# Patient Record
Sex: Male | Born: 1995 | Race: White | Hispanic: No | Marital: Married | Smoking: Never smoker
Health system: Southern US, Community
[De-identification: ages and names within clinical notes are randomized; demographics above are authoritative.]

## PROBLEM LIST (undated history)

## (undated) DIAGNOSIS — T7840XA Allergy, unspecified, initial encounter: Secondary | ICD-10-CM

## (undated) DIAGNOSIS — K219 Gastro-esophageal reflux disease without esophagitis: Secondary | ICD-10-CM

## (undated) DIAGNOSIS — R197 Diarrhea, unspecified: Secondary | ICD-10-CM

## (undated) HISTORY — DX: Allergy, unspecified, initial encounter: T78.40XA

## (undated) HISTORY — DX: Gastro-esophageal reflux disease without esophagitis: K21.9

## (undated) HISTORY — PX: OTHER SURGICAL HISTORY: SHX169

---
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
## 2016-11-21 ENCOUNTER — Ambulatory Visit
Admit: 2016-11-21 | Discharge: 2016-11-21 | Payer: PRIVATE HEALTH INSURANCE | Attending: Family Medicine | Primary: Family Medicine

## 2016-11-21 DIAGNOSIS — B354 Tinea corporis: Secondary | ICD-10-CM

## 2016-11-21 MED ORDER — ECONAZOLE 1 % TOPICAL CREAM
1 % | Freq: Two times a day (BID) | CUTANEOUS | 0 refills | Status: AC
Start: 2016-11-21 — End: ?

## 2016-11-21 NOTE — Progress Notes (Addendum)
[facility]  Sadie Haber, M.D.  [address]  [place], [place] [address]  Office : [phone]  Fax : [phone]  11/21/2016    HISTORY OF PRESENT ILLNESS  Dale Ford is a 21 y.o. male, unaccompanied  Chief Complaint:  rash  HPI:  New patient  Rash on chest for about 2 months.  No new medications (OTC or prescription). No new foods, soaps, detergents, pet or chemical exposures. Itches a little.  Has tried any treatment.    Had knot on right jaw that felt like something was lodged there.  Resolved after about a day  For a few days has had some mild right back pain.  Intermittent.  He had lifted some weights prior.  Feels tight/like it's compressed.      Review of Systems   Constitutional: Negative for fever.   HENT: Negative for sore throat.          No Known Allergies    History reviewed. No pertinent past medical history.    Past Surgical History:   Procedure Laterality Date   ??? HX CYST REMOVAL  06/2010       Social History     Social History   ??? Marital status: SINGLE     Spouse name: N/A   ??? Number of children: N/A   ??? Years of education: N/A     Occupational History   ??? Not on file.     Social History Main Topics   ??? Smoking status: Never Smoker   ??? Smokeless tobacco: Never Used   ??? Alcohol use No   ??? Drug use: No   ??? Sexual activity: Not on file     Other Topics Concern   ??? Not on file     Social History Narrative    Graduate of [employer] [phone]).  [employer] for [id number]       Family Status   Relation Status   ??? Mother Alive   ??? Father Alive   ??? Maternal Grandfather    ??? Paternal Grandfather      Family History   Problem Relation Age of Onset   ??? No Known Problems Mother    ??? No Known Problems Father    ??? Headache Maternal Grandfather    ??? Elevated Lipids Maternal Grandfather    ??? Headache Paternal Grandfather    ??? Elevated Lipids Paternal Grandfather        Visit Vitals   ??? BP 120/70 (BP 1 Location: Left arm, BP Patient Position: Sitting)   ??? Pulse 78    ??? Temp 97.8 ??F (36.6 ??C) (Oral)   ??? Ht 6\' 1"  (1.854 m)   ??? Wt 206 lb (93.4 kg)   ??? SpO2 99%   ??? BMI 27.18 kg/m2       Physical Exam   Constitutional: He appears well-developed and well-nourished. No distress.   HENT:   Head:       Mouth/Throat: Oropharynx is clear and moist and mucous membranes are normal.   Neck: Neck supple. No thyroid mass and no thyromegaly present.   Cardiovascular: Normal rate, regular rhythm and normal heart sounds.    Pulmonary/Chest: Effort normal and breath sounds normal.   Musculoskeletal:        Thoracic back: He exhibits no tenderness, no bony tenderness and no spasm.        Lumbar back: He exhibits no tenderness, no bony tenderness and no spasm.  Back:    Neurological: He is alert. Gait normal.   Skin:        Psychiatric: He has a normal mood and affect.       ASSESSMENT and PLAN  Diagnoses and all orders for this visit:    1. Tinea corporis - - New problem, stable  KOH of skin scrapings show fungal elements  Econazole cream    2. Acute right-sided thoracic back pain - - New problem, stable  Muscular.  May use tylenol/advil as needed, may apply ice/heat as needed.  Notify if any worsening symptoms    3. Lump in neck - - New problem, stable  Probable lymphadenopathy that has resolved.  Notify if any recurrence    Other orders  -     econazole nitrate (SPECTAZOLE) 1 % topical cream; Apply  to affected area two (2) times a day.    Elevated BMI not addressed due to acute problems

## 2016-11-22 NOTE — Telephone Encounter (Signed)
Can use over the counter lotrimin (clotrimazole).  That will be his least expensive option, prescription or not

## 2016-11-22 NOTE — Telephone Encounter (Signed)
Pt mother Dale Ford called on 11/22/2016 and stated that the insurance [facility] and it is too expensive to pay for out of pocket. Pt mother Dale Ford requested a new medication for the pt that is cheaper.

## 2016-11-23 NOTE — Telephone Encounter (Signed)
Called pt on 11/23/2016 and informed him to use Lotrimin (clotrimazole)  which is over the counter and the least expensive option prescription or not. Pt agreed and stated that he purchased it and it was working.

## 2016-12-14 ENCOUNTER — Ambulatory Visit
Admit: 2016-12-14 | Discharge: 2016-12-14 | Payer: PRIVATE HEALTH INSURANCE | Attending: Family Medicine | Primary: Family Medicine

## 2016-12-14 DIAGNOSIS — Z0000 Encounter for general adult medical examination without abnormal findings: Secondary | ICD-10-CM

## 2016-12-14 MED ORDER — ALUMINUM CHLORIDE 20 % TOPICAL SOLN
20 % | CUTANEOUS | 5 refills | Status: DC
Start: 2016-12-14 — End: 2016-12-14

## 2016-12-14 MED ORDER — ALUMINUM CHLORIDE 20 % TOPICAL SOLN
20 % | CUTANEOUS | 5 refills | Status: AC
Start: 2016-12-14 — End: ?

## 2016-12-14 NOTE — Patient Instructions (Addendum)
Learning About Physical Activity  What is physical activity?    Physical activity is any kind of activity that gets your body moving.  The types of physical activity that can help you get fit and stay healthy include:  ?? Aerobic or "cardio" activities that make your heart beat faster and make you breathe harder, such as brisk walking, riding a bike, or running. Aerobic activities strengthen your heart and lungs and build up your endurance.  ?? Strength training activities that make your muscles work against, or "resist," something, such as lifting weights or doing push-ups. These activities help tone and strengthen your muscles.  ?? Stretches that allow you to move your joints and muscles through their full range of motion. Stretching helps you be more flexible and avoid injury.  What are the benefits of physical activity?  Being active is one of the best things you can do to get fit and stay healthy. It helps you to:  ?? Feel stronger and have more energy to do all the things you like to do.  ?? Focus better at school or work and perform better in sports.  ?? Feel, think, and sleep better.  ?? Reach and stay at a healthy weight.  ?? Lose fat and build lean muscle.  ?? Lower your risk for serious health problems.  ?? Keep your bones, muscles, and joints strong.  Being fit lets you do more physical activity. And it lets you work out harder without as much effort.  How can you make physical activity part of your life?  Get at least 30 minutes of exercise on most days of the week. Walking is a good choice. You also may want to do other activities, such as running, swimming, cycling, or playing tennis or team sports.  Pick activities that you like-ones that make your heart beat faster, your muscles stronger, and your muscles and joints more flexible. If you find more than one thing you like doing, do them all. You don't have to do the same thing every day.   Get your heart pumping every day. Any activity that makes your heart beat faster and keeps it at that rate for a while counts.  Here are some great ways to get your heart beating faster:  ?? Go for a brisk walk, run, or bike ride.  ?? Go for a hike or swim.  ?? Go in-line skating.  ?? Play a game of touch football, basketball, or soccer.  ?? Ride a bike.  ?? Play tennis or racquetball.  ?? Climb stairs.  Even some household chores can be aerobic-just do them at a faster pace. Vacuuming, raking or mowing the lawn, sweeping the garage, and washing and waxing the car all can help get your heart rate up.  Strengthen your muscles during the week. You don't have to lift heavy weights or grow big, bulky muscles to get stronger. Doing a few simple activities that make your muscles work against, or "resist," something can help you get stronger.  For example, you can:  ?? Do push-ups or sit-ups, which use your own body weight as resistance.  ?? Lift weights or dumbbells or use stretch bands at home or in a gym or community center.  Stretch your muscles often. Stretching will help you as you become more active. It can help you stay flexible, loosen tight muscles, and avoid injury. It can also help improve your balance and posture and can be a great way to relax.  Be sure   to stretch the muscles you'll be using when you work out. It's best to warm your muscles slightly before you stretch them. Walk or do some other light aerobic activity for a few minutes, and then start stretching.  When you stretch your muscles:  ?? Do it slowly. Stretching is not about going fast or making sudden movements.  ?? Don't push or bounce during a stretch.  ?? Hold each stretch for at least 15 to 30 seconds, if you can. You should feel a stretch in the muscle, but not pain.  ?? Breathe out as you do the stretch. Then breathe in as you hold the stretch. Don't hold your breath.  If you're worried about how more activity might affect your health, have a  checkup before you start. Follow any special advice your doctor gives you for getting a smart start.  Where can you learn more?  Go to http://www.healthwise.net/GoodHelpConnections.  Enter W332 in the search box to learn more about "Learning About Physical Activity."  Current as of: August 22, 2015  Content Version: 11.4  ?? 2006-2017 Healthwise, Incorporated. Care instructions adapted under license by [employer] (which disclaims liability or warranty for this information). If you have questions about a medical condition or this instruction, always ask your healthcare professional. Healthwise, Incorporated disclaims any warranty or liability for your use of this information.

## 2016-12-14 NOTE — Progress Notes (Signed)
[facility]  Sadie Haberavid Kmarion Rawl, M.D.  [address]  [place], [place] [id number]  Office : [phone]  Fax : [phone]  12/14/2016    HISTORY OF PRESENT ILLNESS  Dale Ford is a 21 y.o. male, unaccompanied'  Chief Complaint:  physical  HPI:  In for physical  Feeling well    Review of Systems   Musculoskeletal:        Periodic sore spot on spine         No Known Allergies    History reviewed. No pertinent past medical history.    Past Surgical History:   Procedure Laterality Date   ??? HX CYST REMOVAL  06/2010       Social History     Social History   ??? Marital status: SINGLE     Spouse name: N/A   ??? Number of children: N/A   ??? Years of education: N/A     Occupational History   ??? Not on file.     Social History Main Topics   ??? Smoking status: Never Smoker   ??? Smokeless tobacco: Never Used   ??? Alcohol use No   ??? Drug use: No   ??? Sexual activity: Not on file     Other Topics Concern   ??? Not on file     Social History Narrative    Graduate of [employer] [phone]).  [employer] [address] [id number]       Family Status   Relation Status   ??? Mother Alive   ??? Father Alive   ??? Maternal Grandfather    ??? Paternal Grandfather      Family History   Problem Relation Age of Onset   ??? No Known Problems Mother    ??? No Known Problems Father    ??? Headache Maternal Grandfather    ??? Elevated Lipids Maternal Grandfather    ??? Headache Paternal Grandfather    ??? Elevated Lipids Paternal Grandfather        Visit Vitals   ??? BP 120/70 (BP 1 Location: Left arm, BP Patient Position: Sitting)   ??? Temp 98.1 ??F (36.7 ??C) (Oral)   ??? Ht 6\' 3"  (1.905 m)   ??? Wt 204 lb (92.5 kg)   ??? BMI 25.5 kg/m2       Physical Exam   Constitutional: He appears well-developed and well-nourished.   HENT:   Right Ear: Hearing and tympanic membrane normal.   Left Ear: Hearing and tympanic membrane normal.   Nose: No mucosal edema or rhinorrhea.   Mouth/Throat: Oropharynx is clear and moist and mucous membranes are normal.    Eyes: Pupils are equal, round, and reactive to light.   Neck: Neck supple. No thyroid mass and no thyromegaly present.   Cardiovascular: Normal rate, regular rhythm and normal heart sounds.    Pulses:       Radial pulses are 2+ on the right side, and 2+ on the left side.        Posterior tibial pulses are 2+ on the right side, and 2+ on the left side.   No edema   Pulmonary/Chest: Breath sounds normal.   Abdominal: Soft. Bowel sounds are normal. There is no hepatosplenomegaly. There is no tenderness.   Musculoskeletal:        Back:    Lymphadenopathy:     He has no cervical adenopathy.   Neurological: He is alert. Gait normal.   Reflex Scores:  Bicep reflexes are 1+ on the right side and 1+ on the left side.       Brachioradialis reflexes are 1+ on the right side and 1+ on the left side.       Patellar reflexes are 2+ on the right side and 2+ on the left side.  Psychiatric: He has a normal mood and affect. His speech is normal and behavior is normal.       ASSESSMENT and PLAN  Diagnoses and all orders for this visit:    1. Physical exam, annual  -     CBC WITH AUTOMATED DIFF  -     METABOLIC PANEL, COMPREHENSIVE ([address])  -     LIPID PANEL ([address])  -     URINALYSIS W/MICROSCOPIC  Offered HIV testing, he declines.  Counseled for regular condom use if sexually active  Exercise - 150 minutes per week of moderate aerobic activity (like brisk walking) or 75 minutes/week of high intensity exercise; stretching daily; strength training minimum 2 days/week - handout provided  He reports his Tdap is up to date - check on actual date and let us know  Recommend annual flu vaccine

## 2016-12-14 NOTE — Progress Notes (Signed)
HISTORY OF PRESENT ILLNESS  Dale Ford is a 21 y.o. male.  Chief Complaint:  sweating  HPI:  In for physical  Has had excessive sweating in axilla for a few years.  Has tried several different deodorants/antipersperiants.  No issues with palms or feet    Review of Systems   Constitutional: Negative for fever.         No Known Allergies    History reviewed. No pertinent past medical history.    Past Surgical History:   Procedure Laterality Date   ??? HX CYST REMOVAL  06/2010       Social History     Social History   ??? Marital status: SINGLE     Spouse name: N/A   ??? Number of children: N/A   ??? Years of education: N/A     Occupational History   ??? Not on file.     Social History Main Topics   ??? Smoking status: Never Smoker   ??? Smokeless tobacco: Never Used   ??? Alcohol use No   ??? Drug use: No   ??? Sexual activity: Not on file     Other Topics Concern   ??? Not on file     Social History Narrative    Graduate [employer] [phone]).  [employer] [address] [employer] [address] [id number]       Family Status   Relation Status   ??? Mother Alive   ??? Father Alive   ??? Maternal Grandfather    ??? Paternal Grandfather      Family History   Problem Relation Age of Onset   ??? No Known Problems Mother    ??? No Known Problems Father    ??? Headache Maternal Grandfather    ??? Elevated Lipids Maternal Grandfather    ??? Headache Paternal Grandfather    ??? Elevated Lipids Paternal Grandfather        Visit Vitals   ??? BP 120/70 (BP 1 Location: Left arm, BP Patient Position: Sitting)   ??? Temp 98.1 ??F (36.7 ??C) (Oral)   ??? Ht 6\' 3"  (1.905 m)   ??? Wt 204 lb (92.5 kg)   ??? BMI 25.5 kg/m2     Physical Exam - see other note    ASSESSMENT and PLAN  Diagnoses and all orders for this visit: - separate from physical    1.  Hyperhidrosis - - New problem, stable  May try Drysol - nightly until symptoms improve, then cut back on frequency  -     aluminum chloride (DRYSOL) 20 % external solution; Apply to affected  area each night - may decrease frequency as sweating improves    Contact office if symptoms worsen/persist

## 2016-12-15 LAB — CBC WITH AUTOMATED DIFF
ABS. BASOPHILS: 0.1 10*3/uL (ref 0.0–0.2)
ABS. EOSINOPHILS: 0.3 10*3/uL (ref 0.0–0.4)
ABS. IMM. GRANS.: 0 10*3/uL (ref 0.0–0.1)
ABS. MONOCYTES: 0.4 10*3/uL (ref 0.1–0.9)
ABS. NEUTROPHILS: 1.5 10*3/uL (ref 1.4–7.0)
Abs Lymphocytes: 1.5 10*3/uL (ref 0.7–3.1)
BASOPHILS: 2 %
EOSINOPHILS: 8 %
HCT: 42.7 % (ref 37.5–51.0)
HGB: 14.6 g/dL (ref 13.0–17.7)
IMMATURE GRANULOCYTES: 0 %
Lymphocytes: 40 %
MCH: 30.5 pg (ref 26.6–33.0)
MCHC: 34.2 g/dL (ref 31.5–35.7)
MCV: 89 fL (ref 79–97)
MONOCYTES: 11 %
NEUTROPHILS: 39 %
PLATELET: 245 10*3/uL (ref 150–379)
RBC: 4.79 x10E6/uL (ref 4.14–5.80)
RDW: 12.6 % (ref 12.3–15.4)
WBC: 3.8 10*3/uL (ref 3.4–10.8)

## 2016-12-15 LAB — URINALYSIS W/MICROSCOPIC
Bilirubin: NEGATIVE
Blood: NEGATIVE
Glucose: NEGATIVE
Ketone: NEGATIVE
Leukocyte Esterase: NEGATIVE
Nitrites: NEGATIVE
Protein: NEGATIVE
Specific Gravity: >=1.03 — AB (ref 1.005–1.030)
Urobilinogen: 1 mg/dL (ref 0.2–1.0)
pH (UA): 5.5 (ref 5.0–7.5)

## 2016-12-15 LAB — MICROSCOPIC EXAMINATION
Bacteria: NONE SEEN
Casts: NONE SEEN /lpf

## 2016-12-15 LAB — METABOLIC PANEL, COMPREHENSIVE
A-G Ratio: 2.2 (ref 1.2–2.2)
ALT (SGPT): 20 IU/L (ref 0–44)
AST (SGOT): 22 IU/L (ref 0–40)
Albumin: 4.8 g/dL (ref 3.5–5.5)
Alk. phosphatase: 40 IU/L (ref 39–117)
BUN/Creatinine ratio: 10 (ref 9–20)
BUN: 11 mg/dL (ref 6–20)
Bilirubin, total: 0.6 mg/dL (ref 0.0–1.2)
CO2: 20 mmol/L (ref 20–29)
Calcium: 9.5 mg/dL (ref 8.7–10.2)
Chloride: 104 mmol/L (ref 96–106)
Creatinine: 1.06 mg/dL (ref 0.76–1.27)
GFR est AA: 115 mL/min/{1.73_m2} (ref >59)
GFR est non-AA: 99 mL/min/{1.73_m2} (ref >59)
GLOBULIN, TOTAL: 2.2 g/dL (ref 1.5–4.5)
Glucose: 86 mg/dL (ref 65–99)
Potassium: 4.2 mmol/L (ref 3.5–5.2)
Protein, total: 7 g/dL (ref 6.0–8.5)
Sodium: 141 mmol/L (ref 134–144)

## 2016-12-15 LAB — LIPID PANEL
Cholesterol, total: 124 mg/dL (ref 100–199)
HDL Cholesterol: 34 mg/dL — ABNORMAL LOW (ref >39)
LDL, calculated: 80 mg/dL (ref 0–99)
Triglyceride: 50 mg/dL (ref 0–149)
VLDL, calculated: 10 mg/dL (ref 5–40)

## 2016-12-15 NOTE — Progress Notes (Signed)
Urine is very concentrated  HDL cholesterol is low  Metabolic panel and blood count are ok    Recommendations - drink more water  Cardio can help raise HDL  Can repeat labs in 1 year at annual physical  Please call with results as I am unable to release to mychart at this time

## 2016-12-19 NOTE — Progress Notes (Signed)
Patient notified, voices acceptance and understanding

## 2018-01-15 ENCOUNTER — Ambulatory Visit
Admit: 2018-01-15 | Discharge: 2018-01-15 | Payer: PRIVATE HEALTH INSURANCE | Attending: Family Medicine | Primary: Family Medicine

## 2018-01-15 ENCOUNTER — Ambulatory Visit: Attending: Family Medicine | Primary: Family Medicine

## 2018-01-15 DIAGNOSIS — Z0000 Encounter for general adult medical examination without abnormal findings: Secondary | ICD-10-CM

## 2018-01-15 NOTE — Progress Notes (Signed)
[facility]  Sadie Haber, M.D.  [address]  [place], [place] [address]  Office : [phone]  Fax : [phone]  01/15/2018    HISTORY OF PRESENT ILLNESS  Dale Ford is a 22 y.o. male,  unaccompanied    Chief Complaint:  physical    HPI:  In for physical  Exercise - cardio - running/treadmill, some ab works, sometimes biking - although not as much lately  Got Tdap in 2012    Review of Systems   Constitutional: Positive for malaise/fatigue.       No Known Allergies    History reviewed. No pertinent past medical history.    Past Surgical History:   Procedure Laterality Date   ??? HX CYST REMOVAL  06/2010       Social History     Socioeconomic History   ??? Marital status: SINGLE     Spouse name: Not on file   ??? Number of children: Not on file   ??? Years of education: Not on file   ??? Highest education level: Not on file   Tobacco Use   ??? Smoking status: Never Smoker   ??? Smokeless tobacco: Never Used   Substance and Sexual Activity   ??? Alcohol use: Yes     Frequency: Monthly or less     Drinks per session: 1 or 2     Binge frequency: Never   ??? Drug use: No   Social History [employer] [phone]).  [employer] for [id number]       Family Status   Relation Name Status   ??? Mother  Alive   ??? Father  Alive   ??? MGF  (Not Specified)   ??? PGF  (Not Specified)     Family History   Problem Relation Age of Onset   ??? No Known Problems Mother    ??? No Known Problems Father    ??? Headache Maternal Grandfather    ??? Elevated Lipids Maternal Grandfather    ??? Headache Paternal Grandfather    ??? Elevated Lipids Paternal Grandfather        Visit Vitals  BP 128/72 (BP 1 Location: Left arm, BP Patient Position: Sitting)   Temp 98 ??F (36.7 ??C) (Oral)   Ht 6\' 3"  (1.905 m)   Wt 208 lb (94.3 kg)   BMI 26.00 kg/m??       Physical Exam   Constitutional: He appears well-developed and well-nourished.   HENT:   Right Ear: Tympanic membrane and ear canal normal.   Left Ear: Tympanic membrane and  ear canal normal.   Nose: No mucosal edema or rhinorrhea.   Mouth/Throat: Oropharynx is clear and moist and mucous membranes are normal.   Eyes: Pupils are equal, round, and reactive to light. Conjunctivae and lids are normal.   Neck: Phonation normal. Neck supple. Carotid bruit is not present. No thyroid mass and no thyromegaly present.   Cardiovascular: Normal rate, regular rhythm and normal heart sounds.   Pulses:       Radial pulses are 2+ on the right side, and 2+ on the left side.        Posterior tibial pulses are 2+ on the right side, and 2+ on the left side.   No edema   Pulmonary/Chest: Effort normal and breath sounds normal.   Abdominal: Soft. Bowel sounds are normal. There is no hepatosplenomegaly. There is no tenderness.   Lymphadenopathy:  He has no cervical adenopathy.        Right: No supraclavicular adenopathy present.        Left: No supraclavicular adenopathy present.   Neurological: He is alert. Gait normal.   Reflex Scores:       Bicep reflexes are 1+ on the right side and 1+ on the left side.       Brachioradialis reflexes are 1+ on the right side and 1+ on the left side.       Patellar reflexes are 1+ on the right side and 1+ on the left side.  Skin: Skin is warm and dry.   Psychiatric: He has a normal mood and affect. His speech is normal and behavior is normal.       ASSESSMENT and PLAN  Diagnoses and all orders for this visit:    1. Annual physical exam  -     CBC WITH AUTOMATED DIFF  -     METABOLIC PANEL, COMPREHENSIVE  -     LIPID PANEL  -     URINALYSIS W/MICROSCOPIC    Checking labs.    Encouraged regular exercise - cardio and strength training.  Discussed high intensity interval training.    Discussed elevated BMI - regular exercise and some weight loss can help raise HDL.  Healthy diet  Recommend annual flu vaccine, when available

## 2018-01-15 NOTE — Progress Notes (Signed)
CBC (blood count) is normal  Metabolic panel - blood sugar (glucose), kidney, and electrolytes are normal.  One liver test (ALT) is mildly elevated, other liver tests are normal  Lipid panel - Total cholesterol is ok, but worse than last year.  Triglycerides are normal.  HDL (good cholesterol) is a little better than last year, but remains low.  LDL (bad cholesterol) is mildly elevated, but worse than last year  Urinalysis is normal    Liver test is not significant will watch.  Work on diet a little more closely.  Recheck 1 year at annual physical  Dr. Wilson SingerHudson  Results released to Mychart with comments

## 2018-01-15 NOTE — Progress Notes (Signed)
[facility]  Sadie Haberavid Edinson Domeier, M.D.  [address]  [place], [place] [id number]  Office : [phone]  Fax : [phone]  01/15/2018    HISTORY OF PRESENT ILLNESS  Dale Ford is a 22 y.o. male,  unaccompanied    Chief Complaint:  physical    HPI:  In for physical  Exercise - cardio - running/treadmill, some ab works, sometimes biking - although not as much lately  Got Tdap in 2012    Review of Systems   Constitutional: Positive for malaise/fatigue.       No Known Allergies    History reviewed. No pertinent past medical history.    Past Surgical History:   Procedure Laterality Date   ??? HX CYST REMOVAL  06/2010       Social History     Socioeconomic History   ??? Marital status: SINGLE     Spouse name: Not on file   ??? Number of children: Not on file   ??? Years of education: Not on file   ??? Highest education level: Not on file   Tobacco Use   ??? Smoking status: Never Smoker   ??? Smokeless tobacco: Never Used   Substance and Sexual Activity   ??? Alcohol use: Yes     Frequency: Monthly or less     Drinks per session: 1 or 2     Binge frequency: Never   ??? Drug use: No   Social History [employer]    [employer] [phone]).  [employer] [address] [id number]       Family Status   Relation Name Status   ??? Mother  Alive   ??? Father  Alive   ??? MGF  (Not Specified)   ??? PGF  (Not Specified)     Family History   Problem Relation Age of Onset   ??? No Known Problems Mother    ??? No Known Problems Father    ??? Headache Maternal Grandfather    ??? Elevated Lipids Maternal Grandfather    ??? Headache Paternal Grandfather    ??? Elevated Lipids Paternal Grandfather        Visit Vitals  BP 128/72 (BP 1 Location: Left arm, BP Patient Position: Sitting)   Temp 98 ??F (36.7 ??C) (Oral)   Ht 6\' 3"  (1.905 m)   Wt 208 lb (94.3 kg)   BMI 26.00 kg/m??       Physical Exam   Constitutional: He appears well-developed and well-nourished.   HENT:   Right Ear: Tympanic membrane and ear canal normal.    Left Ear: Tympanic membrane and ear canal normal.   Nose: No mucosal edema or rhinorrhea.   Mouth/Throat: Oropharynx is clear and moist and mucous membranes are normal.   Eyes: Pupils are equal, round, and reactive to light. Conjunctivae and lids are normal.   Neck: Phonation normal. Neck supple. Carotid bruit is not present. No thyroid mass and no thyromegaly present.   Cardiovascular: Normal rate, regular rhythm and normal heart sounds.   Pulses:       Radial pulses are 2+ on the right side, and 2+ on the left side.        Posterior tibial pulses are 2+ on the right side, and 2+ on the left side.   No edema   Pulmonary/Chest: Effort normal and breath sounds normal.   Abdominal: Soft. Bowel sounds are normal. There is no hepatosplenomegaly. There is no tenderness.   Lymphadenopathy:  He has no cervical adenopathy.        Right: No supraclavicular adenopathy present.        Left: No supraclavicular adenopathy present.   Neurological: He is alert. Gait normal.   Reflex Scores:       Bicep reflexes are 1+ on the right side and 1+ on the left side.       Brachioradialis reflexes are 1+ on the right side and 1+ on the left side.       Patellar reflexes are 1+ on the right side and 1+ on the left side.  Skin: Skin is warm and dry.   Psychiatric: He has a normal mood and affect. His speech is normal and behavior is normal.       ASSESSMENT and PLAN  Diagnoses and all orders for this visit:    1. Annual physical exam  -     CBC WITH AUTOMATED DIFF  -     METABOLIC PANEL, COMPREHENSIVE  -     LIPID PANEL  -     URINALYSIS W/MICROSCOPIC    Checking labs.    Encouraged regular exercise - cardio and strength training.  Discussed high intensity interval training.    Discussed elevated BMI - regular exercise and some weight loss can help raise HDL.  Healthy diet  Recommend annual flu vaccine, when available

## 2018-01-15 NOTE — Progress Notes (Signed)
CBC (blood count) is normal  Metabolic panel - blood sugar (glucose), kidney, and electrolytes are normal.  One liver test (ALT) is mildly elevated, other liver tests are normal  Lipid panel - Total cholesterol is ok, but worse than last year.  Triglycerides are normal.  HDL (good cholesterol) is a little better than last year, but remains low.  LDL (bad cholesterol) is mildly elevated, but worse than last year  Urinalysis is normal    Liver test is not significant will watch.  Work on diet a little more closely.  Recheck 1 year at annual physical  Dr. Areej Tayler  Results released to Mychart with comments

## 2018-01-16 LAB — COMPREHENSIVE METABOLIC PANEL
ALT: 58 IU/L — ABNORMAL HIGH (ref 0–44)
AST: 32 IU/L (ref 0–40)
Albumin/Globulin Ratio: 1.9 NA (ref 1.2–2.2)
Albumin: 5.1 g/dL (ref 3.5–5.5)
Alkaline Phosphatase: 39 IU/L (ref 39–117)
BUN: 10 mg/dL (ref 6–20)
Bun/Cre Ratio: 9 NA (ref 9–20)
CO2: 22 mmol/L (ref 20–29)
Calcium: 9.7 mg/dL (ref 8.7–10.2)
Chloride: 101 mmol/L (ref 96–106)
Creatinine: 1.06 mg/dL (ref 0.76–1.27)
EGFR IF NonAfrican American: 98 mL/min/{1.73_m2} (ref >59)
GFR African American: 114 mL/min/{1.73_m2} (ref >59)
Globulin, Total: 2.7 g/dL (ref 1.5–4.5)
Glucose: 83 mg/dL (ref 65–99)
Potassium: 4.3 mmol/L (ref 3.5–5.2)
Sodium: 139 mmol/L (ref 134–144)
Total Bilirubin: 1.2 mg/dL (ref 0.0–1.2)
Total Protein: 7.8 g/dL (ref 6.0–8.5)

## 2018-01-16 LAB — CBC WITH AUTO DIFFERENTIAL
Basophils %: 1 %
Basophils Absolute: 0.1 10*3/uL (ref 0.0–0.2)
Eosinophils %: 5 %
Eosinophils Absolute: 0.3 10*3/uL (ref 0.0–0.4)
Granulocyte Absolute Count: 0 10*3/uL (ref 0.0–0.1)
Hematocrit: 44.1 % (ref 37.5–51.0)
Hemoglobin: 15.6 g/dL (ref 13.0–17.7)
Immature Granulocytes: 0 %
Lymphocytes %: 41 %
Lymphocytes Absolute: 2.1 10*3/uL (ref 0.7–3.1)
MCH: 30.4 pg (ref 26.6–33.0)
MCHC: 35.4 g/dL (ref 31.5–35.7)
MCV: 86 fL (ref 79–97)
Monocytes %: 9 %
Monocytes Absolute: 0.5 10*3/uL (ref 0.1–0.9)
Neutrophils %: 44 %
Neutrophils Absolute: 2.2 10*3/uL (ref 1.4–7.0)
Platelets: 273 10*3/uL (ref 150–450)
RBC: 5.13 x10E6/uL (ref 4.14–5.80)
RDW: 12.8 % (ref 12.3–15.4)
WBC: 5.1 10*3/uL (ref 3.4–10.8)

## 2018-01-16 LAB — URINALYSIS WITH MICROSCOPIC
Bilirubin, Urine: NEGATIVE
Blood, Urine: NEGATIVE
Glucose, UA: NEGATIVE
Ketones, Urine: NEGATIVE
Leukocyte Esterase, Urine: NEGATIVE
Nitrite, Urine: NEGATIVE
Protein, UA: NEGATIVE
Specific Gravity, UA: 1.021 NA (ref 1.005–1.030)
Urobilinogen, Urine: 0.2 mg/dL (ref 0.2–1.0)
pH, UA: 7 NA (ref 5.0–7.5)

## 2018-01-16 LAB — MICROSCOPIC EXAMINATION
BACTERIA, URINE: NONE SEEN
Bacteria: NONE SEEN
Casts UA: NONE SEEN /lpf
Casts: NONE SEEN /lpf

## 2018-01-16 LAB — LIPID PANEL
Cholesterol, Total: 159 mg/dL (ref 100–199)
Cholesterol, total: 159 mg/dL (ref 100–199)
HDL Cholesterol: 37 mg/dL — ABNORMAL LOW (ref >39)
HDL: 37 mg/dL — ABNORMAL LOW (ref >39)
LDL Calculated: 107 mg/dL — ABNORMAL HIGH (ref 0–99)
LDL, calculated: 107 mg/dL — ABNORMAL HIGH (ref 0–99)
Triglyceride: 74 mg/dL (ref 0–149)
Triglycerides: 74 mg/dL (ref 0–149)
VLDL Cholesterol Calculated: 15 mg/dL (ref 5–40)
VLDL, calculated: 15 mg/dL (ref 5–40)

## 2018-01-16 LAB — URINALYSIS W/MICROSCOPIC
Bilirubin: NEGATIVE
Blood: NEGATIVE
Glucose: NEGATIVE
Ketone: NEGATIVE
Leukocyte Esterase: NEGATIVE
Nitrites: NEGATIVE
Protein: NEGATIVE
Specific Gravity: 1.021 (ref 1.005–1.030)
Urobilinogen: 0.2 mg/dL (ref 0.2–1.0)
pH (UA): 7 (ref 5.0–7.5)

## 2018-01-16 LAB — METABOLIC PANEL, COMPREHENSIVE
A-G Ratio: 1.9 (ref 1.2–2.2)
ALT (SGPT): 58 IU/L — ABNORMAL HIGH (ref 0–44)
AST (SGOT): 32 IU/L (ref 0–40)
Albumin: 5.1 g/dL (ref 3.5–5.5)
Alk. phosphatase: 39 IU/L (ref 39–117)
BUN/Creatinine ratio: 9 (ref 9–20)
BUN: 10 mg/dL (ref 6–20)
Bilirubin, total: 1.2 mg/dL (ref 0.0–1.2)
CO2: 22 mmol/L (ref 20–29)
Calcium: 9.7 mg/dL (ref 8.7–10.2)
Chloride: 101 mmol/L (ref 96–106)
Creatinine: 1.06 mg/dL (ref 0.76–1.27)
GFR est AA: 114 mL/min/{1.73_m2} (ref >59)
GFR est non-AA: 98 mL/min/{1.73_m2} (ref >59)
GLOBULIN, TOTAL: 2.7 g/dL (ref 1.5–4.5)
Glucose: 83 mg/dL (ref 65–99)
Potassium: 4.3 mmol/L (ref 3.5–5.2)
Protein, total: 7.8 g/dL (ref 6.0–8.5)
Sodium: 139 mmol/L (ref 134–144)

## 2018-01-16 LAB — CBC WITH AUTOMATED DIFF
ABS. BASOPHILS: 0.1 10*3/uL (ref 0.0–0.2)
ABS. EOSINOPHILS: 0.3 10*3/uL (ref 0.0–0.4)
ABS. IMM. GRANS.: 0 10*3/uL (ref 0.0–0.1)
ABS. MONOCYTES: 0.5 10*3/uL (ref 0.1–0.9)
ABS. NEUTROPHILS: 2.2 10*3/uL (ref 1.4–7.0)
Abs Lymphocytes: 2.1 10*3/uL (ref 0.7–3.1)
BASOPHILS: 1 %
EOSINOPHILS: 5 %
HCT: 44.1 % (ref 37.5–51.0)
HGB: 15.6 g/dL (ref 13.0–17.7)
IMMATURE GRANULOCYTES: 0 %
Lymphocytes: 41 %
MCH: 30.4 pg (ref 26.6–33.0)
MCHC: 35.4 g/dL (ref 31.5–35.7)
MCV: 86 fL (ref 79–97)
MONOCYTES: 9 %
NEUTROPHILS: 44 %
PLATELET: 273 10*3/uL (ref 150–450)
RBC: 5.13 x10E6/uL (ref 4.14–5.80)
RDW: 12.8 % (ref 12.3–15.4)
WBC: 5.1 10*3/uL (ref 3.4–10.8)

## 2019-03-01 ENCOUNTER — Emergency Department (HOSPITAL_BASED_OUTPATIENT_CLINIC_OR_DEPARTMENT_OTHER): Payer: 59

## 2019-03-01 ENCOUNTER — Other Ambulatory Visit: Payer: Self-pay

## 2019-03-01 ENCOUNTER — Emergency Department (HOSPITAL_BASED_OUTPATIENT_CLINIC_OR_DEPARTMENT_OTHER)
Admission: EM | Admit: 2019-03-01 | Discharge: 2019-03-01 | Disposition: A | Discharge status: Home / Self Care | Payer: 59 | Attending: Emergency Medicine | Admitting: Emergency Medicine

## 2019-03-01 ENCOUNTER — Encounter (HOSPITAL_BASED_OUTPATIENT_CLINIC_OR_DEPARTMENT_OTHER): Payer: Self-pay | Admitting: Emergency Medicine

## 2019-03-01 DIAGNOSIS — J029 Acute pharyngitis, unspecified: Secondary | ICD-10-CM

## 2019-03-01 DIAGNOSIS — U071 COVID-19: Secondary | ICD-10-CM | POA: Diagnosis not present

## 2019-03-01 DIAGNOSIS — R079 Chest pain, unspecified: Secondary | ICD-10-CM | POA: Diagnosis not present

## 2019-03-01 LAB — GROUP A STREP BY PCR: Group A Strep by PCR: NOT DETECTED

## 2019-03-01 MED ORDER — DEXAMETHASONE 6 MG PO TABS
10.0000 mg | ORAL_TABLET | Freq: Once | ORAL | Status: AC
  Administered 2019-03-01: 10 mg via ORAL
  Filled 2019-03-01: qty 1

## 2019-03-01 NOTE — ED Triage Notes (Signed)
Pt here with chest pain and palpitations with sore throat x 2 days

## 2019-03-01 NOTE — ED Provider Notes (Signed)
[facility] EMERGENCY DEPARTMENT Provider Note   CSN: [id number] Arrival date & time: 03/01/19  1057     History   Chief Complaint Chief Complaint  Patient presents with  . Chest Pain  . Sore Throat    HPI Peter Woodard is a 23 y.o. male.     The history is provided by the patient.  Sore Throat This is a new problem. The current episode started 2 days ago. The problem occurs daily. The problem has not changed since onset.Associated symptoms include chest pain. Pertinent negatives include no abdominal pain, no headaches and no shortness of breath. Nothing aggravates the symptoms. Nothing relieves the symptoms. He has tried nothing for the symptoms. The treatment provided no relief.    History reviewed. No pertinent past medical history.  There are no active problems to display for this patient.   History reviewed. No pertinent surgical history.      Home Medications    Prior to Admission medications   Not on File    Family History History reviewed. No pertinent family history.  Social History Social History   Tobacco Use  . Smoking status: Never Smoker  . Smokeless tobacco: Never Used  Substance Use Topics  . Alcohol use: Never    Frequency: Never  . Drug use: Never     Allergies   Patient has no allergy information on record.   Review of Systems Review of Systems  Constitutional: Negative for chills and fever.  HENT: Negative for ear pain and sore throat.   Eyes: Negative for pain and visual disturbance.  Respiratory: Positive for cough. Negative for shortness of breath.   Cardiovascular: Positive for chest pain and palpitations.  Gastrointestinal: Negative for abdominal pain and vomiting.  Genitourinary: Negative for dysuria and hematuria.  Musculoskeletal: Negative for arthralgias and back pain.  Skin: Negative for color change and rash.  Neurological: Negative for seizures, syncope and headaches.  All other systems reviewed and  are negative.    Physical Exam Updated Vital Signs  ED Triage Vitals  Enc Vitals Group     BP 03/01/19 1111 (!) 151/87     Pulse Rate 03/01/19 1111 84     Resp 03/01/19 1111 19     Temp 03/01/19 1111 98.6 F (37 C)     Temp src --      SpO2 03/01/19 1111 100 %     Weight --      Height --      Head Circumference --      Peak Flow --      Pain Score 03/01/19 1107 8     Pain Loc --      Pain Edu? --      Excl. in [place]? --     Physical Exam Vitals signs and nursing note reviewed.  Constitutional:      Appearance: He is well-developed.  HENT:     Head: Normocephalic and atraumatic.  Eyes:     Extraocular Movements: Extraocular movements intact.     Conjunctiva/sclera: Conjunctivae normal.     Pupils: Pupils are equal, round, and reactive to light.  Neck:     Musculoskeletal: Normal range of motion and neck supple.     Comments: No signs of pharyngeal inflammation, tonsils without exudates or abscess Cardiovascular:     Rate and Rhythm: Normal rate and regular rhythm.     Pulses:          Radial pulses are 2+ on the right side  and 2+ on the left side.     Heart sounds: Normal heart sounds. No murmur.  Pulmonary:     Effort: Pulmonary effort is normal. No respiratory distress.     Breath sounds: Normal breath sounds. No decreased breath sounds, wheezing or rhonchi.  Chest:     Chest wall: No tenderness.  Abdominal:     Palpations: Abdomen is soft.     Tenderness: There is no abdominal tenderness.  Musculoskeletal: Normal range of motion.     Right lower leg: No edema.     Left lower leg: No edema.  Skin:    General: Skin is warm and dry.  Neurological:     Mental Status: He is alert.      ED Treatments / Results  Labs (all labs ordered are listed, but only abnormal results are displayed) Labs Reviewed  GROUP A STREP BY PCR  NOVEL CORONAVIRUS, NAA (HOSP ORDER, SEND-OUT TO REF LAB; TAT 18-24 HRS)    EKG EKG Interpretation  Date/Time:  Sunday March 01 2019 11:10:17 EDT Ventricular Rate:  75 PR Interval:    QRS Duration: 84 QT Interval:  367 QTC Calculation: 410 R Axis:   70 Text Interpretation:  Sinus rhythm Consider left atrial enlargement Confirmed by Virgina Norfolk [phone]) on 03/01/2019 12:23:32 PM   Radiology Dg Chest Portable 1 View  Result Date: 03/01/2019 CLINICAL DATA:  Cough EXAM: PORTABLE CHEST 1 VIEW COMPARISON:  None. FINDINGS: The heart size and mediastinal contours are within normal limits. Both lungs are clear. The visualized skeletal structures are unremarkable. IMPRESSION: No acute abnormality of the lungs in AP portable projection. Electronically Signed   By: Lauralyn Primes M.D.   On: 03/01/2019 12:23    Procedures Procedures (including critical care time)  Medications Ordered in ED Medications  dexamethasone (DECADRON) tablet 10 mg (has no administration in time range)     Initial Impression / Assessment and Plan / ED Course  I have reviewed the triage vital signs and the nursing notes.  Pertinent labs & imaging results that were available during my care of the patient were reviewed by me and considered in my medical decision making (see chart for details).     Peter Woodard is a 23 year old male with no significant medical history who presents to the ED with sore throat, chest pain, cough.  Patient with normal vitals.  No fever.  Symptoms for the last 2 days.  Denies any fevers at home.  Works a Youth worker job.  No known coronavirus exposures.  Patient has clear breath sounds.  No signs of throat infection on exam.  However will swab for coronavirus, strep pharyngitis and proceed chest x-ray.  EKG showed sinus rhythm.  No ischemic changes.  Patient is PERC negative and doubt PE.  Patient does not have any cardiac risk factors and symptoms are more consistent with a viral process than ACS.  Strep screen negative.  Chest x-ray without any signs of pneumonia, no pneumothorax, no pleural effusion.  COVID test is  pending.  Given information for self-isolation.  Given Decadron for symptomatic relief.  Given return precautions and discharged in ED in good condition.  This chart was dictated using voice recognition software.  Despite best efforts to proofread,  errors can occur which can change the documentation meaning.    Final Clinical Impressions(s) / ED Diagnoses   Final diagnoses:  Sore throat    ED Discharge Orders    None       Mahonri Seiden,  Madina Galati, DO 03/01/19 1233

## 2019-03-02 LAB — NOVEL CORONAVIRUS, NAA (HOSP ORDER, SEND-OUT TO REF LAB; TAT 18-24 HRS): SARS-CoV-2, NAA: DETECTED — AB

## 2019-03-24 ENCOUNTER — Ambulatory Visit: Payer: 59 | Admitting: Family Medicine

## 2019-04-16 ENCOUNTER — Telehealth: Payer: Self-pay

## 2019-04-16 NOTE — Telephone Encounter (Signed)
Questions for Screening COVID-19    Travel or Contacts: No  During this illness, did/does the patient experience any of the following symptoms? Fever >100.83F []   Yes [x]   No []   Unknown Subjective fever (felt feverish) []   Yes [x]   No []   Unknown Chills []   Yes [x]   No []   Unknown Muscle aches (myalgia) []   Yes [x]   No []   Unknown Runny nose (rhinorrhea) []   Yes [x]   No []   Unknown Sore throat []   Yes [x]   No []   Unknown Cough (new onset or worsening of chronic cough) []   Yes [x]   No []   Unknown Shortness of breath (dyspnea) []   Yes [x]   No []   Unknown Nausea or vomiting []   Yes [x]   No []   Unknown Headache []   Yes [x]   No []   Unknown Abdominal pain  []   Yes [x]   No []   Unknown Diarrhea (?3 loose/looser than normal stools/24hr period) []   Yes [x]   No []   Unknown Other, specify:

## 2019-04-17 ENCOUNTER — Other Ambulatory Visit: Payer: Self-pay

## 2019-04-17 ENCOUNTER — Encounter: Payer: Self-pay | Admitting: Family Medicine

## 2019-04-17 ENCOUNTER — Ambulatory Visit (INDEPENDENT_AMBULATORY_CARE_PROVIDER_SITE_OTHER): Payer: 59 | Admitting: Family Medicine

## 2019-04-17 VITALS — BP 142/98 | HR 78 | Temp 98.6°F | Ht 75.0 in | Wt 230.0 lb

## 2019-04-17 DIAGNOSIS — I10 Essential (primary) hypertension: Secondary | ICD-10-CM | POA: Insufficient documentation

## 2019-04-17 DIAGNOSIS — K649 Unspecified hemorrhoids: Secondary | ICD-10-CM

## 2019-04-17 DIAGNOSIS — Z13220 Encounter for screening for lipoid disorders: Secondary | ICD-10-CM

## 2019-04-17 DIAGNOSIS — Z0000 Encounter for general adult medical examination without abnormal findings: Secondary | ICD-10-CM

## 2019-04-17 DIAGNOSIS — R030 Elevated blood-pressure reading, without diagnosis of hypertension: Secondary | ICD-10-CM | POA: Diagnosis not present

## 2019-04-17 LAB — COMPREHENSIVE METABOLIC PANEL
ALT: 34 U/L (ref 0–53)
AST: 21 U/L (ref 0–37)
Albumin: 5.4 g/dL — ABNORMAL HIGH (ref 3.5–5.2)
Alkaline Phosphatase: 37 U/L — ABNORMAL LOW (ref 39–117)
BUN: 12 mg/dL (ref 6–23)
CO2: 26 mEq/L (ref 19–32)
Calcium: 10.1 mg/dL (ref 8.4–10.5)
Chloride: 102 mEq/L (ref 96–112)
Creatinine, Ser: 0.96 mg/dL (ref 0.40–1.50)
GFR: 95.53 mL/min (ref >60.00)
Glucose, Bld: 91 mg/dL (ref 70–99)
Potassium: 3.8 mEq/L (ref 3.5–5.1)
Sodium: 138 mEq/L (ref 135–145)
Total Bilirubin: 1.1 mg/dL (ref 0.2–1.2)
Total Protein: 7.8 g/dL (ref 6.0–8.3)

## 2019-04-17 LAB — LIPID PANEL
Cholesterol: 158 mg/dL (ref 0–200)
HDL: 32.6 mg/dL — ABNORMAL LOW (ref >39.00)
LDL Cholesterol: 101 mg/dL — ABNORMAL HIGH (ref 0–99)
NonHDL: 125.47
Total CHOL/HDL Ratio: 5
Triglycerides: 120 mg/dL (ref 0.0–149.0)
VLDL: 24 mg/dL (ref 0.0–40.0)

## 2019-04-17 LAB — TSH: TSH: 1.02 u[IU]/mL (ref 0.35–4.50)

## 2019-04-17 LAB — CBC
HCT: 44.9 % (ref 39.0–52.0)
Hemoglobin: 15.6 g/dL (ref 13.0–17.0)
MCHC: 34.8 g/dL (ref 30.0–36.0)
MCV: 86.4 fl (ref 78.0–100.0)
Platelets: 279 10*3/uL (ref 150.0–400.0)
RBC: 5.19 Mil/uL (ref 4.22–5.81)
RDW: 12.6 % (ref 11.5–15.5)
WBC: 6.3 10*3/uL (ref 4.0–10.5)

## 2019-04-17 NOTE — Assessment & Plan Note (Signed)
-  Recommend increased fiber and fluid intake to keep stool soft.  -May use preparation h/sitz bath as needed for symptomatic treatment.

## 2019-04-17 NOTE — Assessment & Plan Note (Signed)
-  BP mildly elevated today, possibly anxiety related to appt today.  Recommend low salt diet and regular exercise.

## 2019-04-17 NOTE — Assessment & Plan Note (Signed)
Well adult Orders Placed This Encounter  Procedures  . Comp Met (CMET)  . CBC  . Lipid panel  . TSH  Immunizations:  Declines flu vaccine Screening: Lipid Anticipatory guidance/Risk factor reduction:  Recommendations per AVS

## 2019-04-17 NOTE — Patient Instructions (Signed)
Preventive Care 19-25 Years Old, Male Preventive care refers to lifestyle choices and visits with your health care provider that can promote health and wellness. This includes:  A yearly physical exam. This is also called an annual well check.  Regular dental and eye exams.  Immunizations.  Screening for certain conditions.  Healthy lifestyle choices, such as eating a healthy diet, getting regular exercise, not using drugs or products that contain nicotine and tobacco, and limiting alcohol use. What can I expect for my preventive care visit? Physical exam Your health care provider will check:  Height and weight. These may be used to calculate body mass index (BMI), which is a measurement that tells if you are at a healthy weight.  Heart rate and blood pressure.  Your skin for abnormal spots. Counseling Your health care provider may ask you questions about:  Alcohol, tobacco, and drug use.  Emotional well-being.  Home and relationship well-being.  Sexual activity.  Eating habits.  Work and work Statistician. What immunizations do I need?  Influenza (flu) vaccine  This is recommended every year. Tetanus, diphtheria, and pertussis (Tdap) vaccine  You may need a Td booster every 10 years. Varicella (chickenpox) vaccine  You may need this vaccine if you have not already been vaccinated. Human papillomavirus (HPV) vaccine  If recommended by your health care provider, you may need three doses over 6 months. Measles, mumps, and rubella (MMR) vaccine  You may need at least one dose of MMR. You may also need a second dose. Meningococcal conjugate (MenACWY) vaccine  One dose is recommended if you are 45-76 years old and a Market researcher living in a residence hall, or if you have one of several medical conditions. You may also need additional booster doses. Pneumococcal conjugate (PCV13) vaccine  You may need this if you have certain conditions and were not  previously vaccinated. Pneumococcal polysaccharide (PPSV23) vaccine  You may need one or two doses if you smoke cigarettes or if you have certain conditions. Hepatitis A vaccine  You may need this if you have certain conditions or if you travel or work in places where you may be exposed to hepatitis A. Hepatitis B vaccine  You may need this if you have certain conditions or if you travel or work in places where you may be exposed to hepatitis B. Haemophilus influenzae type b (Hib) vaccine  You may need this if you have certain risk factors. You may receive vaccines as individual doses or as more than one vaccine together in one shot (combination vaccines). Talk with your health care provider about the risks and benefits of combination vaccines. What tests do I need? Blood tests  Lipid and cholesterol levels. These may be checked every 5 years starting at age 17.  Hepatitis C test.  Hepatitis B test. Screening   Diabetes screening. This is done by checking your blood sugar (glucose) after you have not eaten for a while (fasting).  Sexually transmitted disease (STD) testing. Talk with your health care provider about your test results, treatment options, and if necessary, the need for more tests. Follow these instructions at home: Eating and drinking   Eat a diet that includes fresh fruits and vegetables, whole grains, lean protein, and low-fat dairy products.  Take vitamin and mineral supplements as recommended by your health care provider.  Do not drink alcohol if your health care provider tells you not to drink.  If you drink alcohol: ? Limit how much you have to 0-2  drinks a day. ? Be aware of how much alcohol is in your drink. In the [place] one drink equals one 12 oz bottle of beer (355 mL), one 5 oz glass of wine (148 mL), or one 1 oz glass of hard liquor (44 mL). Lifestyle  Take daily care of your teeth and gums.  Stay active. Exercise for at least 30 minutes on 5 or  more days each week.  Do not use any products that contain nicotine or tobacco, such as cigarettes, e-cigarettes, and chewing tobacco. If you need help quitting, ask your health care provider.  If you are sexually active, practice safe sex. Use a condom or other form of protection to prevent STIs (sexually transmitted infections). What's next?  Go to your health care provider once a year for a well check visit.  Ask your health care provider how often you should have your eyes and teeth checked.  Stay up to date on all vaccines. This information is not intended to replace advice given to you by your health care provider. Make sure you discuss any questions you have with your health care provider. Document Released: 07/24/2001 Document Revised: 05/22/2018 Document Reviewed: 05/22/2018 Elsevier Patient Education  2020 [employer]  

## 2019-04-17 NOTE — Progress Notes (Signed)
Peter Woodard - 23 y.o. male MRN [id number]  Date of birth: 26-May-1996  Subjective Chief Complaint  Patient presents with  . Annual Exam    cpe X fasting.. patient is concern about blood when he wipes sometimes.    HPI Peter Woodard is a 23 y.o. male without known medical problems here today for initial visit and annual exam.  He reports that he has been in good health as far as he knows. Had [place] in September and has recovered well from this.   Has a little bit of anxiety regarding recently moving here, new job and wedding in a couple of months but feels like he is handling this ok.  He does reports a couple of occasions that he has noticed blood on toilet tissue after wiping.  This is usually after straining or a hard BM.  This has improved since changing jobs.  He denies blood in his stool or abdominal pain, weight change or changes to appetite.  He has no family history of colon cancer or IBD.  He is a non-smoker and consumes EtOH occasionally.    Review of Systems  Constitutional: Negative for chills, fever, malaise/fatigue and weight loss.  HENT: Negative for congestion, ear pain and sore throat.   Eyes: Negative for blurred vision, double vision and pain.  Respiratory: Negative for cough and shortness of breath.   Cardiovascular: Negative for chest pain and palpitations.  Gastrointestinal: Negative for abdominal pain, blood in stool, constipation, heartburn and nausea.  Genitourinary: Negative for dysuria and urgency.  Musculoskeletal: Negative for joint pain and myalgias.  Neurological: Negative for dizziness and headaches.  Endo/Heme/Allergies: Does not bruise/bleed easily.  Psychiatric/Behavioral: Negative for depression. The patient is not nervous/anxious and does not have insomnia.     No Known Allergies  History reviewed. No pertinent past medical history.  History reviewed. No pertinent surgical history.  Social History   Socioeconomic History  . Marital status:  Single    Spouse name: Not on file  . Number of children: Not on file  . Years of education: Not on file  . Highest education level: Not on file  Occupational History  . Not on file  Social Needs  . Financial resource strain: Not on file  . Food insecurity    Worry: Not on file    Inability: Not on file  . Transportation needs    Medical: Not on file    Non-medical: Not on file  Tobacco Use  . Smoking status: Never Smoker  . Smokeless tobacco: Never Used  Substance and Sexual Activity  . Alcohol use: Never    Frequency: Never  . Drug use: Never  . Sexual activity: Not Currently  Lifestyle  . Physical activity    Days per week: Not on file    Minutes per session: Not on file  . Stress: Not on file  Relationships  . Social Herbalist on phone: Not on file    Gets together: Not on file    Attends religious service: Not on file    Active member of club or organization: Not on file    Attends meetings of clubs or organizations: Not on file    Relationship status: Not on file  Other Topics Concern  . Not on file  Social History Narrative  . Not on file    Family History  Problem Relation Age of Onset  . Diabetes Maternal Grandmother   . Heart disease Paternal Grandmother  Health Maintenance  Topic Date Due  . HIV Screening  05/31/2009  . INFLUENZA VACCINE  09/09/2019 (Originally 01/10/2019)  . TETANUS/TDAP  11/09/2020    ----------------------------------------------------------------------------------------------------------------------------------------------------------------------------------------------------------------- Physical Exam BP (!) 142/98   Pulse 78   Temp 98.6 F (37 C) (Temporal)   Ht _0  (1.905 m)   Wt 230 lb (104.3 kg)   SpO2 97%   BMI 28.75 kg/m   Physical Exam Constitutional:      General: He is not in acute distress. HENT:     Head: Normocephalic and atraumatic.     Right Ear: External ear normal.     Left Ear:  External ear normal.     Mouth/Throat:     Mouth: Mucous membranes are moist.  Eyes:     General: No scleral icterus. Neck:     Musculoskeletal: Normal range of motion.     Thyroid: No thyromegaly.  Cardiovascular:     Rate and Rhythm: Normal rate and regular rhythm.     Heart sounds: Normal heart sounds.  Pulmonary:     Effort: Pulmonary effort is normal.     Breath sounds: Normal breath sounds.  Abdominal:     General: Bowel sounds are normal. There is no distension.     Palpations: Abdomen is soft.     Tenderness: There is no abdominal tenderness. There is no guarding.  Genitourinary:    Comments: Small, non-thrombosed external hemorrhoid.  No fissure noted.  Lymphadenopathy:     Cervical: No cervical adenopathy.  Skin:    General: Skin is warm and dry.     Findings: No rash.  Neurological:     Mental Status: He is alert and oriented to person, place, and time.     Cranial Nerves: No cranial nerve deficit.     Motor: No abnormal muscle tone.  Psychiatric:        Mood and Affect: Mood normal.        Behavior: Behavior normal.     ------------------------------------------------------------------------------------------------------------------------------------------------------------------------------------------------------------------- Assessment and Plan  Blood pressure elevated without history of HTN -BP mildly elevated today, possibly anxiety related to appt today.  Recommend low salt diet and regular exercise.   Well adult exam Well adult Orders Placed This Encounter  Procedures  . Comp Met (CMET)  . CBC  . Lipid panel  . TSH  Immunizations:  Declines flu vaccine Screening: Lipid Anticipatory guidance/Risk factor reduction:  Recommendations per AVS  Hemorrhoid -Recommend increased fiber and fluid intake to keep stool soft.  -May use preparation h/sitz bath as needed for symptomatic treatment.

## 2020-08-31 ENCOUNTER — Encounter: Payer: Self-pay | Admitting: Family Medicine

## 2020-08-31 ENCOUNTER — Ambulatory Visit (INDEPENDENT_AMBULATORY_CARE_PROVIDER_SITE_OTHER): Payer: 59 | Admitting: Family Medicine

## 2020-08-31 ENCOUNTER — Other Ambulatory Visit: Payer: Self-pay

## 2020-08-31 VITALS — BP 142/80 | HR 89 | Temp 98.6°F | Ht 75.0 in | Wt 256.0 lb

## 2020-08-31 DIAGNOSIS — I10 Essential (primary) hypertension: Secondary | ICD-10-CM | POA: Diagnosis not present

## 2020-08-31 DIAGNOSIS — Z131 Encounter for screening for diabetes mellitus: Secondary | ICD-10-CM

## 2020-08-31 DIAGNOSIS — R21 Rash and other nonspecific skin eruption: Secondary | ICD-10-CM | POA: Diagnosis not present

## 2020-08-31 DIAGNOSIS — Z6832 Body mass index (BMI) 32.0-32.9, adult: Secondary | ICD-10-CM

## 2020-08-31 DIAGNOSIS — E669 Obesity, unspecified: Secondary | ICD-10-CM | POA: Diagnosis not present

## 2020-08-31 DIAGNOSIS — K649 Unspecified hemorrhoids: Secondary | ICD-10-CM | POA: Diagnosis not present

## 2020-08-31 DIAGNOSIS — R1110 Vomiting, unspecified: Secondary | ICD-10-CM | POA: Insufficient documentation

## 2020-08-31 DIAGNOSIS — Z13220 Encounter for screening for lipoid disorders: Secondary | ICD-10-CM

## 2020-08-31 DIAGNOSIS — Z1159 Encounter for screening for other viral diseases: Secondary | ICD-10-CM

## 2020-08-31 DIAGNOSIS — Z114 Encounter for screening for human immunodeficiency virus [HIV]: Secondary | ICD-10-CM

## 2020-08-31 MED ORDER — HYDROCORTISONE ACETATE 25 MG RE SUPP: 25.0000 mg | Freq: Two times a day (BID) | RECTAL | 1 refills | Status: AC

## 2020-08-31 NOTE — Progress Notes (Signed)
[facility] [address] [place] [address] Dept: [phone] Dept Fax: [phone]  Transfer of Care Office Visit  Subjective:    Patient ID: Peter Woodard, male    DOB: [birth year], 25 y.o..   MRN: [id number]  Chief Complaint  Patient presents with  . Establish Care    TOC- CPE/labs.  C/o having acid reflux x 1 1/2 years, red spots on face/chest off/on, uses Triamcinolone cream.  Wants refill on Anucort HC.      History of Present Illness:  Patient is in today to establish care. Peter Woodard is originally from [place], [place], but has lived in this area quite a while. He is married. He denies tobacco use or alcohol use.  Peter Woodard notes several issues today. He has a history of a rash on his central chest. This is scaly and pruritic at times. He has a prescription for TAC cream that he uses to manage this. In the past 2 years, he has developed a scaly rash on his face, involving both cheeks near the nose, the forehead,a nd int he beard area in front of the ears. He notes he had significant dandruff as a child and used a prescription shampoo to manage this.  Peter Woodard has a history of hemorrhoids. He has used Anusol HC suppositories in the past. He has had a recent flare, with perianal burning sensation. He requests a refill of his Anusol.  Peter Woodard notes a 1 1/2 year history of frequent gagging and occasional vomiting. He says he feels like he now has a sensitivity to certain foods causing him to gag and vomit. He is unclear about whether there is a degree of heartburn associated with this. His mother and grandmother had significant intestinal issues and he wonders if he has inherited these. His father has had issues with reflux. He has not tried medication to see if it will help this.  Peter Woodard notes a 40 lb. weight gain since 2019. He admits that a significant part of this has been poor diet. He also points to the role that  stress has played in his life over the past 4 years, with several job changes and a marriage. He is concerned about his weight and has started making dietary changes. He recently returned to the gym as well and plans to gradually increase his activity.  Past Medical History: Patient Active Problem List   Diagnosis Date Noted  . Recurrent vomiting 08/31/2020  . Obesity 08/31/2020  . Essential hypertension- Stage 1 04/17/2019  . Hemorrhoid 04/17/2019   Past Surgical History:  Procedure Laterality Date  . cyst removal     rectal area   Family History  Problem Relation Age of Onset  . Diabetes Maternal Grandmother   . Heart disease Paternal Grandmother   . Hypertension Father   . Heart disease Maternal Grandfather        heart attack  . Diabetes Other    Outpatient Medications Prior to Visit  Medication Sig Dispense Refill  . hydrocortisone (ANUSOL-HC) 25 MG suppository Place 25 mg rectally 2 (two) times daily.    Marland Kitchen loratadine-pseudoephedrine (CLARITIN-D 12 HOUR) 5-120 MG tablet     . triamcinolone (KENALOG) 0.1 % Apply 1 application topically 2 (two) times daily.    Marland Kitchen econazole nitrate 1 % cream Apply topically. (Patient not taking: Reported on 08/31/2020)     No facility-administered medications prior to visit.   No Known Allergies    Objective:  Today's Vitals   08/31/20 1436  Pulse: 89  Temp: 98.6 F (37 C)  TempSrc: Temporal  SpO2: 96%  Weight: 256 lb (116.1 kg)  Height: 6\' 3"  (1.905 m)   Body mass index is 32 kg/m.   General: Well developed, well nourished. No acute distress. HEENT: Normocephalic, non-traumatic. PERRL, EOMI. Conjunctiva clear. External ears normal. EAC and TMs   normal bilaterally. Nose clear without congestion or rhinorrhea. Mucous membranes moist. Oropharynx clear.  Good dentition. Neck: Supple. No lymphadenopathy. No thyromegaly. Lungs: Clear to auscultation bilaterally. CV: RRR without murmurs or rubs. Pulses 2+ bilaterally. Skin: Warm  and dry. There is a lacy red macule on both the cheeks, near the nose. There are some scattered patches of slightly red, scaly skin in the margins of the beard, on the forehead and central chest. Psych: Alert and oriented. Normal mood and affect.  Health Maintenance Due  Topic Date Due  . Hepatitis C Screening  Never done  . COVID-19 Vaccine (1) Never done  . HPV VACCINES (1 - Male 2-dose series) Never done  . INFLUENZA VACCINE  01/10/2020      Assessment & Plan:   1. Essential hypertension- Stage 1 Peter Woodard's blood pressure remains elevated consistent with Stage 1 hypertension. I discussed with him about the potential long-term complications of hypertension. We reviewed both non-pharmacologic and pharmacologic approaches to treating hypertension. At this point, he prefers to not start a medication and target a 10-15% weight loss by Dec. I will plan to follow-up with him in 3 months to reassess his pressure and weight loss.  2. Class 1 obesity without serious comorbidity with body mass index (BMI) of 32.0 to 32.9 in adult, unspecified obesity type We discussed the impact his weight gain is having towards his blood pressure. I support the efforts Peter Woodard is making to reduce his weight. We will reassess in 3 months.  3. Hemorrhoids, unspecified hemorrhoid type I will renew his Anusol suppositories.  - hydrocortisone (ANUSOL-HC) 25 MG suppository; Place 1 suppository (25 mg total) rectally 2 (two) times daily.  Dispense: 12 suppository; Refill: 1  4. Facial rash The rash may represent a seborrheic dermatitis, but it has not been responsive to TAC cream. I would also consider if this might be rosacea or other cause. I will refer to dermatology for assessment.  - Ambulatory referral to Dermatology  5. Recurrent vomiting This may be due to GERD underlying this. We discussed a trial of OTC Prilosec. If not improving by his next visit, we will consider further workup.  6. Screening for  lipid disorders  - Lipid panel; Future  7. Screening for diabetes mellitus (DM)  - Hemoglobin A1c; Future  8. Encounter for hepatitis C screening test for low risk patient  - HCV Ab w Reflex to Quant PCR; Future  9. Screening for HIV (human immunodeficiency virus)  - HIV Antibody (routine testing w rflx); Future   Freida Busman, MD

## 2020-09-09 ENCOUNTER — Other Ambulatory Visit: Payer: Self-pay

## 2020-09-09 ENCOUNTER — Other Ambulatory Visit (INDEPENDENT_AMBULATORY_CARE_PROVIDER_SITE_OTHER): Payer: 59

## 2020-09-09 DIAGNOSIS — Z1159 Encounter for screening for other viral diseases: Secondary | ICD-10-CM

## 2020-09-09 DIAGNOSIS — Z114 Encounter for screening for human immunodeficiency virus [HIV]: Secondary | ICD-10-CM

## 2020-09-09 DIAGNOSIS — Z13220 Encounter for screening for lipoid disorders: Secondary | ICD-10-CM

## 2020-09-09 DIAGNOSIS — Z131 Encounter for screening for diabetes mellitus: Secondary | ICD-10-CM | POA: Diagnosis not present

## 2020-09-09 LAB — LIPID PANEL
Cholesterol: 158 mg/dL (ref 0–200)
HDL: 27.2 mg/dL — ABNORMAL LOW (ref >39.00)
LDL Cholesterol: 103 mg/dL — ABNORMAL HIGH (ref 0–99)
NonHDL: 130.58
Total CHOL/HDL Ratio: 6
Triglycerides: 140 mg/dL (ref 0.0–149.0)
VLDL: 28 mg/dL (ref 0.0–40.0)

## 2020-09-09 LAB — HEMOGLOBIN A1C: Hgb A1c MFr Bld: 5.5 % (ref 4.6–6.5)

## 2020-09-10 LAB — HCV AB W REFLEX TO QUANT PCR: HCV Ab: <0.1 s/co ratio (ref 0.0–0.9)

## 2020-09-10 LAB — HCV INTERPRETATION

## 2020-09-12 LAB — HIV ANTIBODY (ROUTINE TESTING W REFLEX): HIV 1&2 Ab, 4th Generation: NONREACTIVE

## 2020-11-29 ENCOUNTER — Encounter: Payer: Self-pay | Admitting: Family Medicine

## 2020-11-29 DIAGNOSIS — L219 Seborrheic dermatitis, unspecified: Secondary | ICD-10-CM | POA: Insufficient documentation

## 2020-12-02 ENCOUNTER — Ambulatory Visit: Payer: 59 | Admitting: Family Medicine

## 2021-08-18 IMAGING — DX DG CHEST 1V PORT
1 series · 1 of 1 positions shown · non-contrast
Comparison: None.

CLINICAL DATA: Cough

EXAM:
PORTABLE CHEST 1 VIEW

[chest ap]
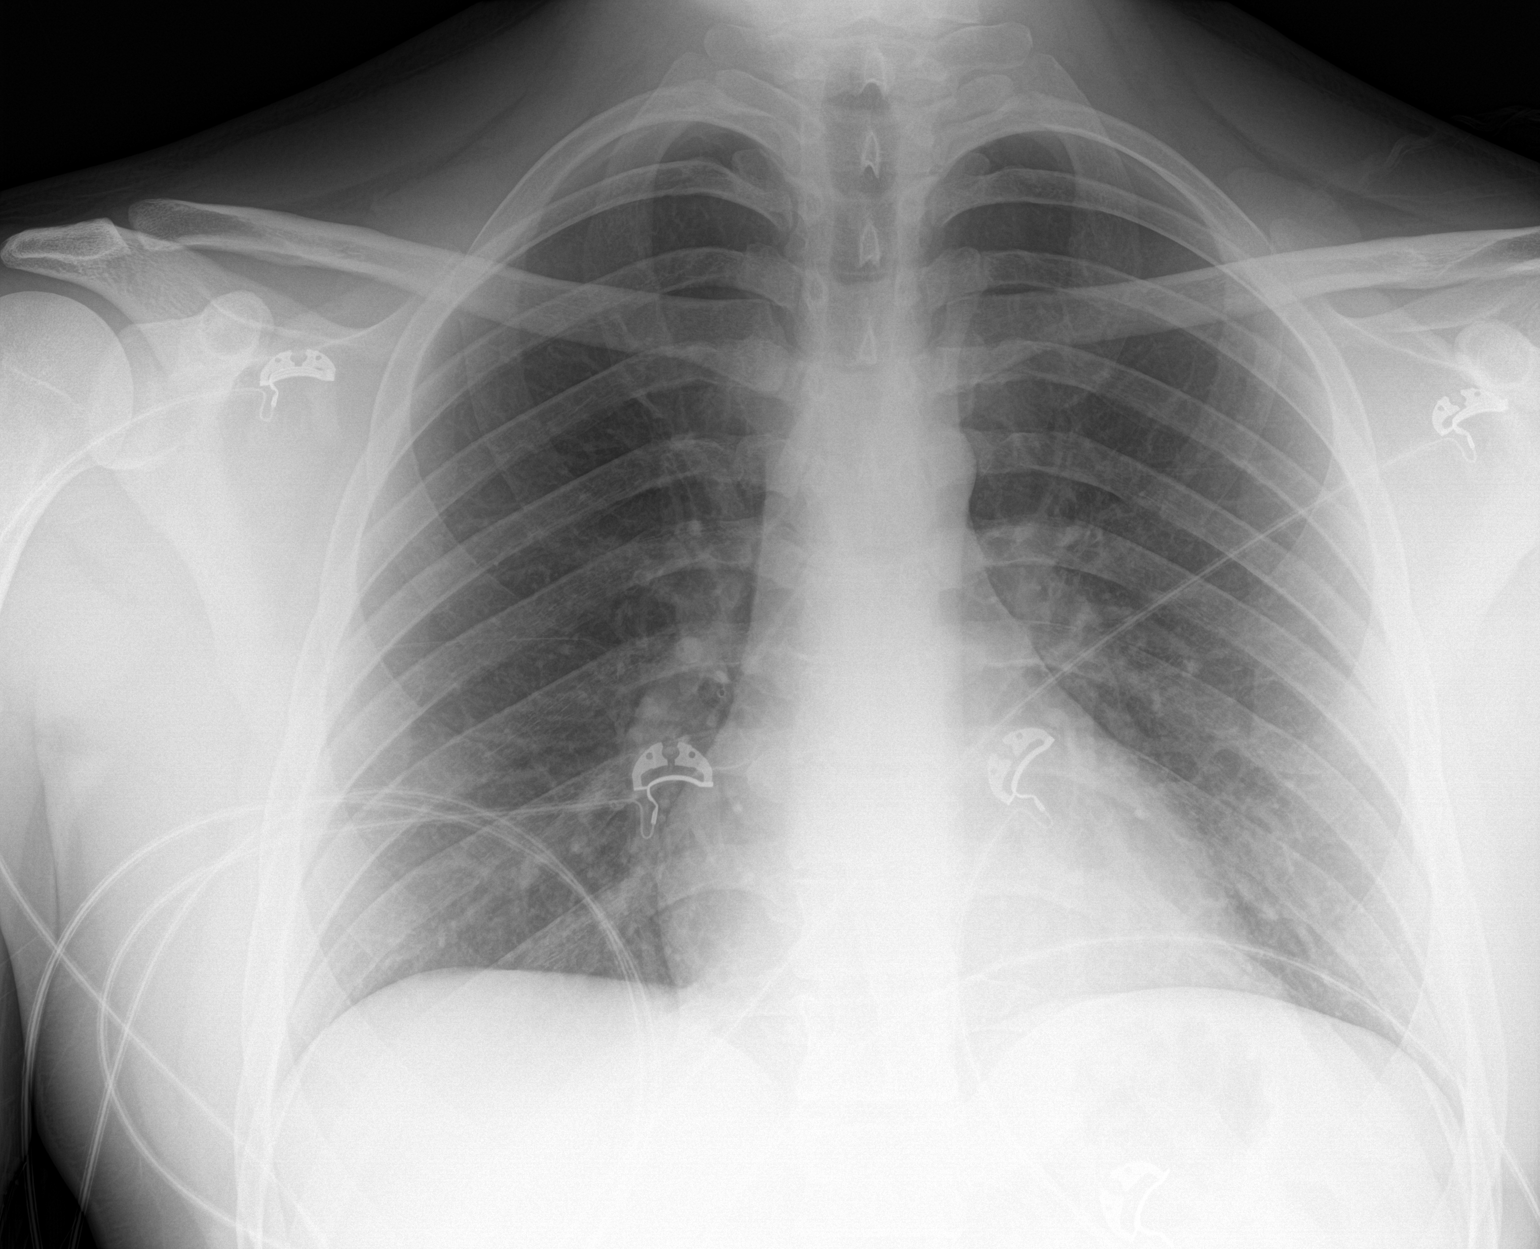

[1 of 1 positions shown; findings below may reference images not displayed]

FINDINGS: The heart size and mediastinal contours are within normal limits.
Both lungs are clear. The visualized skeletal structures are
unremarkable.
IMPRESSION: No acute abnormality of the lungs in AP portable projection.
